# Patient Record
Sex: Female | Born: 1980 | Race: Black or African American | Hispanic: No | Marital: Single | Smoking: Current every day smoker
Health system: Southern US, Community
[De-identification: ages and names within clinical notes are randomized; demographics above are authoritative.]

## PROBLEM LIST (undated history)

## (undated) DIAGNOSIS — R87629 Unspecified abnormal cytological findings in specimens from vagina: Secondary | ICD-10-CM

## (undated) DIAGNOSIS — A599 Trichomoniasis, unspecified: Secondary | ICD-10-CM

## (undated) DIAGNOSIS — I10 Essential (primary) hypertension: Secondary | ICD-10-CM

## (undated) DIAGNOSIS — IMO0002 Reserved for concepts with insufficient information to code with codable children: Secondary | ICD-10-CM

## (undated) DIAGNOSIS — Q774 Achondroplasia: Secondary | ICD-10-CM

## (undated) DIAGNOSIS — B977 Papillomavirus as the cause of diseases classified elsewhere: Secondary | ICD-10-CM

## (undated) HISTORY — DX: Achondroplasia: Q77.4

## (undated) HISTORY — PX: OTHER SURGICAL HISTORY: SHX169

## (undated) HISTORY — DX: Reserved for concepts with insufficient information to code with codable children: IMO0002

## (undated) HISTORY — DX: Essential (primary) hypertension: I10

## (undated) HISTORY — DX: Trichomoniasis, unspecified: A59.9

## (undated) HISTORY — DX: Unspecified abnormal cytological findings in specimens from vagina: R87.629

## (undated) HISTORY — DX: Papillomavirus as the cause of diseases classified elsewhere: B97.7

---

## 2003-03-11 ENCOUNTER — Observation Stay (HOSPITAL_COMMUNITY): Admission: EM | Admit: 2003-03-11 | Discharge: 2003-03-12 | Payer: Self-pay

## 2003-03-12 ENCOUNTER — Encounter: Payer: Self-pay | Admitting: Internal Medicine

## 2004-12-11 ENCOUNTER — Ambulatory Visit: Payer: Self-pay

## 2005-04-01 ENCOUNTER — Ambulatory Visit (HOSPITAL_COMMUNITY): Admission: RE | Admit: 2005-04-01 | Discharge: 2005-04-01 | Payer: Self-pay | Admitting: Family Medicine

## 2006-08-06 IMAGING — CR DG ABDOMEN 1V
1 series · 1 of 1 positions shown · non-contrast
Comparison: None.

CLINICAL DATA: Questionable palpable mass over the lumbosacral spine on palpation, question severe constipation.  History of achondroplasia.
ABDOMEN - [id number]:

[view not recorded]
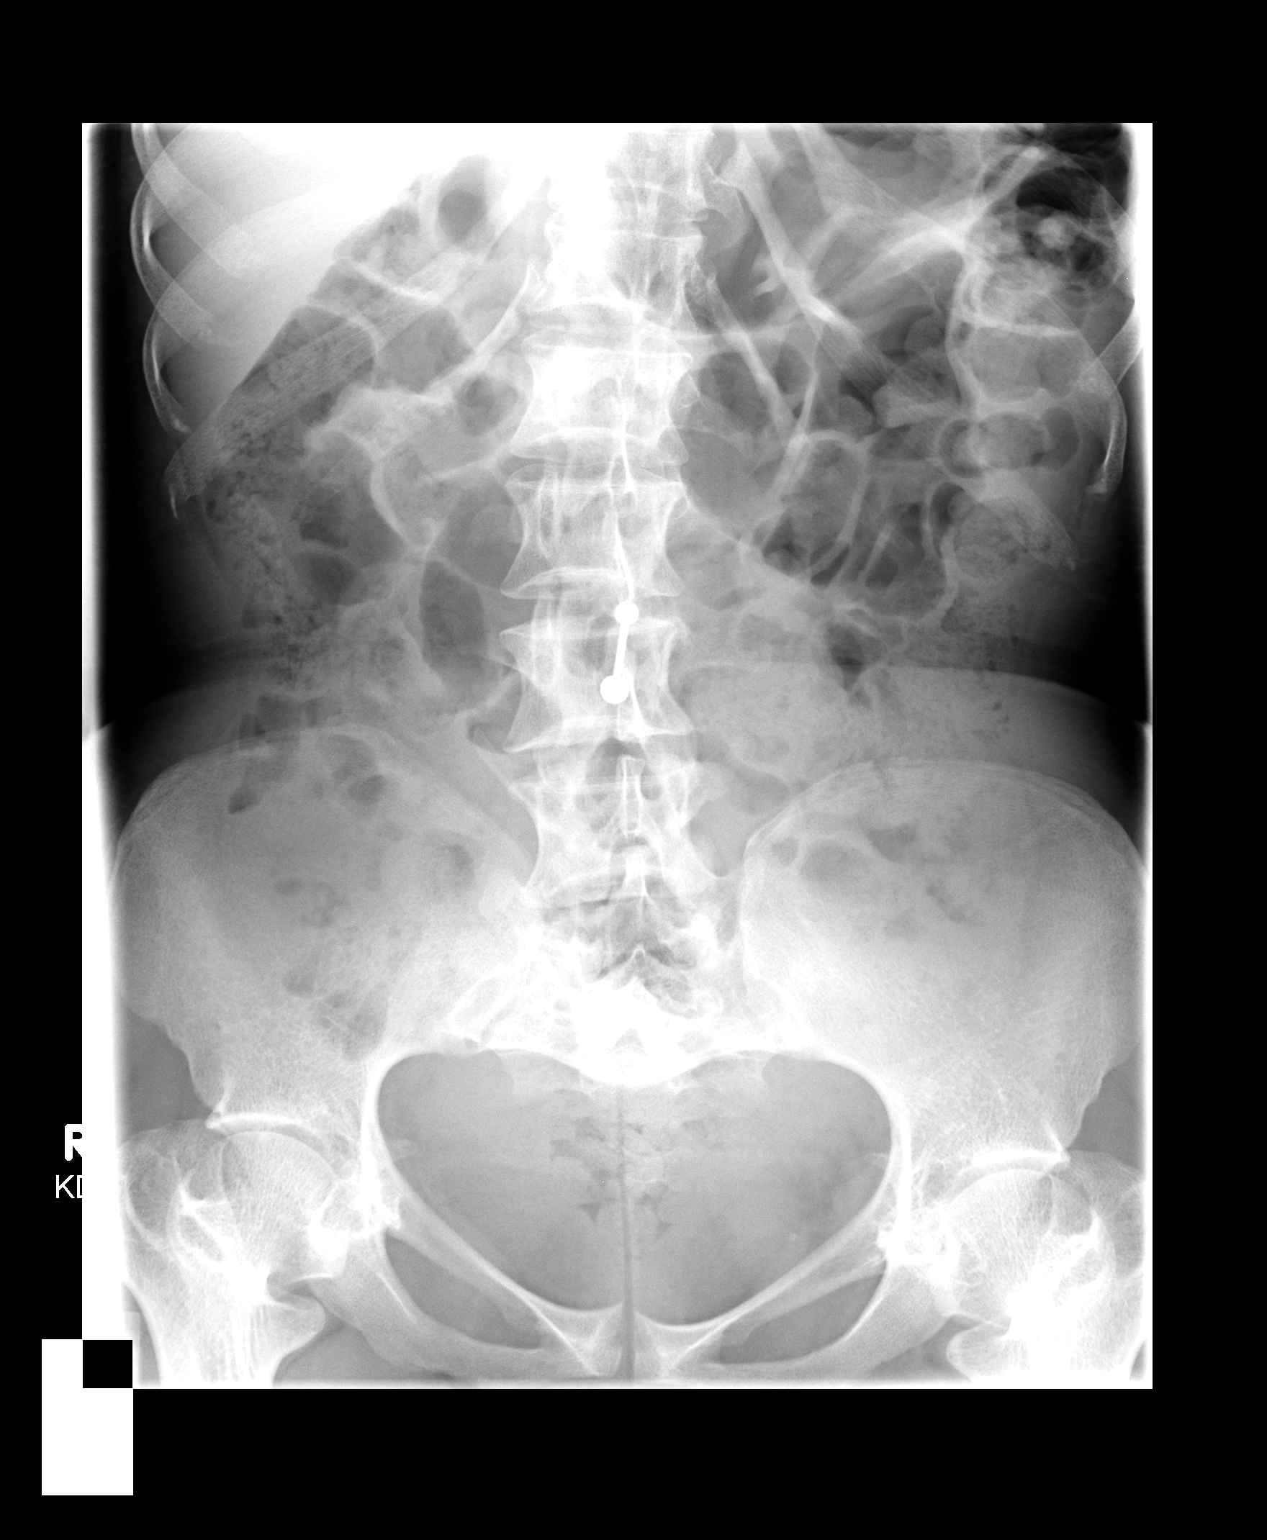

[1 of 1 positions shown; findings below may reference images not displayed]

FINDINGS: Stool was seen throughout the length of the nondilated colon.  No dilated loop of small bowel is present. Presence or absence of free air cannot be assessed on this single supine view. A radiopaque object projects over the lumbar spine, likely a belly button ring. The pelvis has an abnormal configuration, with spurring of the iliac spines and foreshortening of the femoral necks bilaterally, consistent with achondroplasia.
IMPRESSION: 1.  No radiographic evidence for obstruction on the single supine film.  Stool throughout the colon without evidence of colonic dilatation. 
2.  Osseous abnormalities consistent with known achondroplasia.
3.  The presence or absence of free air cannot be assessed on the single supine view. 
The findings were called to Dr. Jim?[REDACTED] by Dr. Jefferson on 04/01/05.

## 2012-11-07 ENCOUNTER — Encounter: Payer: Self-pay | Admitting: *Deleted

## 2012-11-07 DIAGNOSIS — IMO0002 Reserved for concepts with insufficient information to code with codable children: Secondary | ICD-10-CM

## 2012-11-14 ENCOUNTER — Encounter: Payer: Self-pay | Admitting: *Deleted

## 2012-11-15 ENCOUNTER — Encounter: Payer: Self-pay | Admitting: Obstetrics & Gynecology

## 2012-11-21 ENCOUNTER — Encounter: Payer: Self-pay | Admitting: Obstetrics & Gynecology

## 2012-11-23 ENCOUNTER — Ambulatory Visit (INDEPENDENT_AMBULATORY_CARE_PROVIDER_SITE_OTHER): Payer: Medicaid Other | Admitting: Obstetrics & Gynecology

## 2012-11-23 ENCOUNTER — Encounter: Payer: Self-pay | Admitting: Obstetrics & Gynecology

## 2012-11-23 VITALS — BP 114/80 | Ht <= 58 in | Wt 83.0 lb

## 2012-11-23 DIAGNOSIS — R87810 Cervical high risk human papillomavirus (HPV) DNA test positive: Secondary | ICD-10-CM

## 2012-11-23 DIAGNOSIS — R87619 Unspecified abnormal cytological findings in specimens from cervix uteri: Secondary | ICD-10-CM | POA: Insufficient documentation

## 2012-11-23 DIAGNOSIS — IMO0002 Reserved for concepts with insufficient information to code with codable children: Secondary | ICD-10-CM

## 2012-11-23 DIAGNOSIS — R87610 Atypical squamous cells of undetermined significance on cytologic smear of cervix (ASC-US): Secondary | ICD-10-CM

## 2012-11-23 DIAGNOSIS — Q774 Achondroplasia: Secondary | ICD-10-CM

## 2012-11-23 NOTE — Patient Instructions (Addendum)
Cervical Dysplasia Cervical dysplasia is a condition in which a woman has abnormal changes in the cells of her cervix. The cervix is the opening to the uterus (womb) between the vagina and the uterus. These changes are called cervical dysplasia and may be the first signs of cervical cancer. These cells can be taken from the cervix during a Pap test and then looked at under a microscope. With early detection, treatment, and close follow-up care, nearly all cervical dysplasia can be cured. If untreated, the mild to moderate stages of dysplasia often grow more severe.  RISK FACTORS  The following increase the risk for cervical dysplasia.  Having had a sexually transmitted disease, including:  Chlamydia.  Human papilloma virus (HPV).  Becoming sexually active before age 18.  Having had more than 1 sexual partner.  Not using protection, such as condoms, during sexual intercourse, especially with new sexual partners.  Having had cancer of the vagina or vulva.  Having a sexual partner whose previous partner had cancer of the cervix or cervical dysplasia.  Having a sexual partner who has or has had cancer of the penis.  Having a weakened immune system (HIV, organ transplant).  Being the daughter of a woman who took DES (diethylstilbestrol) during pregnancy.  A history of cervical cancer in a woman's sister or mother.  Smoking.  Having had an abnormal Pap test in the past. SYMPTOMS  There are usually no symptoms. If there are symptoms, they may be vague such as:  Abnormal vaginal discharge.  Bleeding between periods or following intercourse.  Bleeding during menopause.  Pain on intercourse (dyspareunia). DIAGNOSIS   The Pap test is the best way of detecting abnormalities of the cervix.  Biopsy (removing a piece of tissue to look at under the microscope) of the cervix when the Pap test is abnormal or when the Pap test is normal, but the cervix looks abnormal. TREATMENT    Catching and treating the changes early with Pap tests can prevent cervical cancer.  Cryotherapy freezes the abnormal cells with a steel tip instrument.  A laser can be used to remove the abnormal cells.  Loop electrocautery excision procedure (LEEP). This procedure uses a heated electrical loop to remove a cone-like portion of the cervix, including the cervical canal.  For more serious cases of cervical dysplasia, the abnormal tissue may be removed surgically by:  A cone biopsy (by cold knife, laser or LEEP). A procedure in which a portion of the center of the cervix with the cervical canal is removed.  The uterus and cervix are removed (hysterectomy). Your caregiver will advise you regarding the need and timing of Pap tests in your follow-up. Women who have been treated for dysplasia should be closely followed with pelvic exams and Pap tests. During the first year following treatment of cervical dysplasia, Pap tests should be done every 3 to 4 months. In the second year, the schedule is every 6 months, or as recommended by your caregiver. See your caregiver for new or worsening problems. HOME CARE INSTRUCTIONS   Follow the instructions and recommendations of your caregiver regarding medicines and follow-up appointments.  Only take over-the-counter or prescription medicines for pain or discomfort as directed by your caregiver.  Cramping and pelvic discomfort may follow cryotherapy. It is not abnormal to have watery discharge for several weeks after.  Laser, cone surgery, cryotherapy or LEEP can cause a bad smelling vaginal discharge. It may also cause vaginal bleeding for a couple weeks following the procedure. The   discharge may be black from the paste used to control bleeding from the cone site. This is normal.  Do not use tampons, have sexual intercourse or douche until your caregiver says it is okay. SEEK MEDICAL CARE IF:   You develop genital warts.  You need a prescription for  pain medicine following your treatment. SEEK IMMEDIATE MEDICAL CARE IF:   Your bleeding is heavier than a normal menstrual period.  You develop bright red bleeding, especially if you have blood clots.  You have a fever.  You have increasing cramps or pain not relieved with medicine.  You are lightheaded, unusually weak, or have fainting spells.  You have abnormal vaginal discharge.  You develop abdominal pain. PREVENTION   The surest way to prevent cervical dysplasia is to abstain from sexual intercourse.  Practice safe sex, use condoms and have only one sex partner who does not have other sex partners.  A Pap test is done to screen for cervical cancer.  The first Pap test should be done at age 21.  Between ages 21 and 29, Pap tests are repeated every 2 years.  Beginning at age 30, you are advised to have a Pap test every 3 years as long as your past 3 Pap tests have been normal.  Some women have medical problems that increase the chance of getting cervical cancer. Talk to your caregiver about these problems. It is especially important to talk to your caregiver if a new problem develops soon after your last Pap test. In these cases, your caregiver may recommend more frequent screening and Pap tests.  The above recommendations are the same for women who have or have not gotten the vaccine for HPV (Human Papillomavirus).  If you had a hysterectomy for a problem that was not a cancer or a condition that could lead to cancer, then you no longer need Pap tests. However, even if you no longer need a Pap test, a regular exam is a good idea to make sure no other problems are starting.   If you are between ages 65 and 70, and you have had normal Pap tests going back 10 years, you no longer need Pap tests. However, even if you no longer need a Pap test, a regular exam is a good idea to make sure no other problems are starting.   If you have had past treatment for cervical cancer or a  condition that could lead to cancer, you need Pap tests and screening for cancer for at least 20 years after your treatment.  If Pap tests have been discontinued, risk factors (such as a new sexual partner) need to be re-assessed to determine if screening should be resumed.  Some women may need screenings more often if they are at high risk for cervical cancer.  Your caregiver may do additional tests including:  Colposcopy. A procedure in which a special microscope magnifies the cells and allows the provider to closely examine the cervix, vagina, and vulva.  Biopsy. A small tissue sample is taken from the cervix, vagina or vulva. This is generally done in your caregivers office.  A cone biopsy (cold knife or laser). A large tissue sample is taken from the cervix. This procedure is usually done in an operating room under a general anesthetic. The cone often removes all abnormal tissue and so may also complete the treatment.  LEEP, also removing a circular portion of the cervix and is done in a doctors office under a local anesthetic.  Now   there is a vaccine, Gardasil, that was developed to prevent the HPV'S that can cause cancer of the cervix and genital warts. It is recommended for females ages 9 to 26. It should not be given to pregnant women until more is known about its effects on the fetus. Not all cancers of the cervix are caused by the HPV. Routine gynecology exams and Pap tests should continue as recommended by your caregiver. Document Released: 07/20/2005 Document Revised: 10/12/2011 Document Reviewed: 07/11/2008 [employer] Patient Information 2013 [employer].  

## 2012-11-23 NOTE — Progress Notes (Signed)
Patient ID: Adrienne Macdonald, female   DOB: 14-Dec-1980, 32 y.o.   MRN: [id number] Patient ID: Adrienne Macdonald, female   DOB: [birth year], 32 y.o.   MRN: [id number]  Chief Complaint  Patient presents with  . Referral    abnormal pap    HPI Adrienne Macdonald is a 32 y.o. female.   HPI  Indications: Pap smear on March 2014 showed: ASCUS with POSITIVE high risk HPV. Previous colposcopy: normal exam without visible pathology and in 2013. Prior cervical treatment: no treatment.  Past Medical History  Diagnosis Date  . Abnormal Pap smear   . Trichimoniasis     Past Surgical History  Procedure Laterality Date  . Cesarean section      Family History  Problem Relation Age of Onset  . Hypertension Mother   . Hyperlipidemia Mother   . Thyroid disease Mother   . Stroke Father   . Hypertension Father     Social History History  Substance Use Topics  . Smoking status: Current Every Day Smoker -- 0.50 packs/day    Types: Cigarettes  . Smokeless tobacco: Not on file  . Alcohol Use: Yes     Comment: socially    Allergies  Allergen Reactions  . Penicillins     Current Outpatient Prescriptions  Medication Sig Dispense Refill  . lisinopril-hydrochlorothiazide (PRINZIDE,ZESTORETIC) 10-12.5 MG per tablet Take 1 tablet by mouth daily.      . medroxyPROGESTERone (DEPO-PROVERA) 150 MG/ML injection Inject 150 mg into the muscle every 3 (three) months.       No current facility-administered medications for this visit.    Review of Systems Review of Systems  Blood pressure 114/80, height 4\' 2"  (1.27 m), weight 83 lb (37.649 kg), last menstrual period 10/15/2012.  Physical Exam Physical Exam  Data Reviewed pap  Assessment    Procedure Details  The risks and benefits of the procedure and Written informed consent obtained.  Speculum placed in vagina and excellent visualization of cervix achieved, cervix swabbed x 3 with acetic acid solution.  Specimens: none  Complications:  none.   Normal colposcopy  Plan    Repeat pap 1 year hre      [name] 11/23/2012, 4:38 PM

## 2014-06-04 ENCOUNTER — Encounter: Payer: Self-pay | Admitting: Obstetrics & Gynecology

## 2014-06-11 DIAGNOSIS — R87629 Unspecified abnormal cytological findings in specimens from vagina: Secondary | ICD-10-CM

## 2014-06-11 DIAGNOSIS — B977 Papillomavirus as the cause of diseases classified elsewhere: Secondary | ICD-10-CM

## 2014-06-11 HISTORY — DX: Unspecified abnormal cytological findings in specimens from vagina: R87.629

## 2014-06-11 HISTORY — DX: Papillomavirus as the cause of diseases classified elsewhere: B97.7

## 2014-06-11 LAB — HM PAP SMEAR: HM PAP: POSITIVE

## 2014-07-17 ENCOUNTER — Encounter: Payer: Self-pay | Admitting: *Deleted

## 2014-07-18 ENCOUNTER — Encounter: Payer: Self-pay | Admitting: Obstetrics & Gynecology

## 2014-07-18 ENCOUNTER — Encounter: Payer: Medicaid Other | Admitting: Obstetrics & Gynecology

## 2014-12-18 ENCOUNTER — Encounter: Payer: Self-pay | Admitting: *Deleted

## 2014-12-24 ENCOUNTER — Ambulatory Visit (INDEPENDENT_AMBULATORY_CARE_PROVIDER_SITE_OTHER): Payer: Medicaid Other | Admitting: Obstetrics & Gynecology

## 2014-12-24 ENCOUNTER — Other Ambulatory Visit: Payer: Self-pay | Admitting: Obstetrics & Gynecology

## 2014-12-24 ENCOUNTER — Encounter: Payer: Self-pay | Admitting: Obstetrics & Gynecology

## 2014-12-24 VITALS — BP 180/100 | HR 72 | Wt 92.0 lb

## 2014-12-24 DIAGNOSIS — N900 Mild vulvar dysplasia: Secondary | ICD-10-CM

## 2014-12-24 DIAGNOSIS — R896 Abnormal cytological findings in specimens from other organs, systems and tissues: Secondary | ICD-10-CM

## 2014-12-24 DIAGNOSIS — IMO0002 Reserved for concepts with insufficient information to code with codable children: Secondary | ICD-10-CM

## 2014-12-24 NOTE — Addendum Note (Signed)
Addended by: Criss AlvinePULLIAM, CHRYSTAL G on: 12/24/2014 03:47 PM   Modules accepted: Orders

## 2014-12-24 NOTE — Progress Notes (Signed)
Patient ID: Adrienne Macdonald, female   DOB: 12-29-1980, 34 y.o.   MRN: [id number] . Colposcopy Procedure Note  Indications: Pap smear 6 months ago showed: low-grade squamous intraepithelial neoplasia (LGSIL - encompassing HPV,mild dysplasia,CIN I). The prior pap showed ASCUS with NEGATIVE high risk HPV.  Prior cervical/vaginal disease: normal exam without visible pathology. Prior cervical treatment: no treatment.  Procedure Details  The risks and benefits of the procedure and Written informed consent obtained.  Speculum placed in vagina and excellent visualization of cervix achieved, cervix swabbed x 3 with acetic acid solution.  Findings: Cervix: acetowhite lesion(s) noted at  o'clock and punctation noted at  o'clock; cervical biopsies taken at 7 o'clock. Vaginal inspection: normal without visible lesions. Vulvar colposcopy: vulvar colposcopy not performed.  Specimens: cervical bio[psy x 1  Complications: none.  Plan: Return to discuss Pathology results in 1 week.

## 2015-01-01 ENCOUNTER — Ambulatory Visit: Payer: Medicaid Other | Admitting: Obstetrics & Gynecology

## 2015-01-02 ENCOUNTER — Ambulatory Visit: Payer: Medicaid Other | Admitting: Obstetrics & Gynecology

## 2015-01-02 ENCOUNTER — Telehealth: Payer: Self-pay | Admitting: Obstetrics & Gynecology

## 2015-01-02 NOTE — Telephone Encounter (Signed)
Pt states she had an appt today to discuss cervical biopsy unable to come due to car trouble. Requesting to get results by phone.

## 2015-01-02 NOTE — Telephone Encounter (Signed)
We need to repeat her Pap in 1 year, no treatment at this level needed opefully her own "immune" systme will take care of it, but she has to make sure she gets her follow up next year

## 2015-01-02 NOTE — Telephone Encounter (Signed)
Pt informed of mild dysplasia from cervical biopsy from 12/24/2014. Pt to repeat pap in 1 year. Pt verbalized understanding.

## 2017-05-03 ENCOUNTER — Encounter: Payer: Self-pay | Admitting: *Deleted

## 2017-05-04 ENCOUNTER — Encounter: Payer: Self-pay | Admitting: *Deleted

## 2017-05-04 ENCOUNTER — Encounter: Payer: Self-pay | Admitting: Obstetrics & Gynecology

## 2018-02-17 ENCOUNTER — Encounter: Payer: Medicaid Other | Admitting: Obstetrics & Gynecology

## 2018-02-17 ENCOUNTER — Encounter: Payer: Self-pay | Admitting: *Deleted

## 2018-03-08 ENCOUNTER — Ambulatory Visit: Payer: Medicaid Other | Admitting: Podiatry

## 2018-03-09 ENCOUNTER — Ambulatory Visit: Payer: Medicaid Other | Admitting: Podiatry

## 2018-03-18 ENCOUNTER — Encounter: Payer: Medicaid Other | Admitting: Podiatry

## 2018-03-23 NOTE — Progress Notes (Signed)
This encounter was created in error - please disregard.

## 2019-10-19 ENCOUNTER — Telehealth: Payer: Self-pay | Admitting: Obstetrics & Gynecology

## 2019-10-19 NOTE — Telephone Encounter (Signed)
Could not call the patient to remind her of her appointment/restrictions due to there is no phone number on file.

## 2019-10-23 ENCOUNTER — Encounter: Payer: Self-pay | Admitting: Obstetrics & Gynecology

## 2020-11-14 ENCOUNTER — Encounter: Payer: Medicaid Other | Admitting: Women's Health

## 2020-11-26 ENCOUNTER — Ambulatory Visit: Payer: Medicaid Other | Admitting: Women's Health

## 2020-11-26 ENCOUNTER — Encounter: Payer: Self-pay | Admitting: Women's Health

## 2020-11-26 ENCOUNTER — Other Ambulatory Visit: Payer: Self-pay

## 2020-11-26 VITALS — BP 147/96 | HR 83 | Ht <= 58 in | Wt 86.5 lb

## 2020-11-26 DIAGNOSIS — Z3202 Encounter for pregnancy test, result negative: Secondary | ICD-10-CM | POA: Diagnosis not present

## 2020-11-26 DIAGNOSIS — R87613 High grade squamous intraepithelial lesion on cytologic smear of cervix (HGSIL): Secondary | ICD-10-CM

## 2020-11-26 LAB — POCT URINE PREGNANCY: Preg Test, Ur: NEGATIVE

## 2020-11-26 NOTE — Progress Notes (Signed)
   COLPOSCOPY PROCEDURE NOTE Patient name: Adrienne Macdonald MRN [id number]  Date of birth: 01/02/1981 Subjective Findings:   Adrienne Macdonald is a 40 y.o. G36P1001 African American female being seen today for a colposcopy. Indication: Abnormal pap on 09/15/19: HSIL w/ HRHPV positive @ [facility]  Prior cytology:  Date Result Procedure  2018 LSIL w/ +HRHPV did not come for colpo  2015 LSIL w/ HRHPV positive Colposcopy: w/ bx CIN-I  2014 LSIL-H w/ HRHPV  not done None      Patient's last menstrual period was 10/27/2020 (approximate). Contraception: Depo-Provera injections. Menopausal: no. Hysterectomy: no.   Smoker: yes. Immunocompromised: no.  The risks and benefits were explained and informed consent was obtained, and written copy is in chart. Pertinent History Reviewed:   Reviewed past medical,surgical, social, obstetrical and family history.  Reviewed problem list, medications and allergies. Objective Findings & Procedure:   Vitals:   11/26/20 1123  BP: (!) 147/96  Pulse: 83  Weight: 86 lb 8 oz (39.2 kg)  Height: 4\' 2"  (1.27 m)  Body mass index is 24.33 kg/m.  Results for orders placed or performed in visit on 11/26/20 (from the past 24 hour(s))  POCT urine pregnancy   Collection Time: 11/26/20 11:35 AM  Result Value Ref Range   Preg Test, Ur Negative Negative     Time out was performed.  Speculum placed in the vagina, cervix fully visualized. SCJ: fully visualized. Cervix swabbed x 3 with acetic acid.  Acetowhitening present: Yes Cervix: circumferential light ACW w/ mosaiscm from 12-2 o'clock. cervical biopsies taken at 12-1 o'clock and hemostasis achieved with Monsel's solution. Vagina: vaginal colposcopy not performed Vulva: vulvar colposcopy not performed  Specimens: 1  Complications: none  Preceptor: Dr. 11/28/20 Colposcopic Impression & Plan:   Colposcopy findings consistent with HSIL Plan: Post biopsy instructions given, Will base plan of care on pathology results and  ASCCP guidelines and Will call patient with results  Return for will call pt w/ results.  Despina Hidden CNM, [facility] 11/26/2020 1:08 PM

## 2020-11-26 NOTE — Patient Instructions (Signed)
[email or web address]  Colposcopy, Care After This sheet gives you information about how to care for yourself after your procedure. Your health care provider may also give you more specific instructions. If you have problems or questions, contact your health care provider. What can I expect after the procedure? If you had a colposcopy without a biopsy, you can expect to feel fine right away after your procedure. However, you may have some spotting of blood for a few days. You can return to your normal activities. If you had a colposcopy with a biopsy, it is common after the procedure to have:  Soreness and mild pain. These may last for a few days.  Light-headedness.  Mild vaginal bleeding or discharge that is dark-colored and grainy. This may last for a few days. The discharge may be caused by a liquid (solution) that was used during the procedure. You may need to wear a sanitary pad during this time.  Spotting of blood for at least 48 hours after the procedure. Follow these instructions at home: Medicines  Take over-the-counter and prescription medicines only as told by your health care provider.  Talk with your health care provider about what type of over-the-counter pain medicine and prescription medicine you can start to take again. It is especially important to talk with your health care provider if you take blood thinners. Activity  Limit your physical activity for the first day after your procedure as told by your health care provider.  Avoid using douche products, using tampons, or having sex for at least 3 days after the procedure or for as long as told.  Return to your normal activities as told by your health care provider. Ask your health care provider what activities are safe for you. General instructions  Drink enough fluid to keep your urine pale yellow.  Ask your health care provider if you may take baths, swim, or use a hot tub. You may take  showers.  If you use birth control (contraception), continue to use it.  Keep all follow-up visits as told by your health care provider. This is important.   Contact a health care provider if:  You develop a skin rash. Get help right away if:  You bleed a lot from your vagina or pass blood clots. This includes using more than one sanitary pad each hour for 2 hours in a row.  You have a fever or chills.  You have vaginal discharge that is abnormal, is yellow in color, or smells bad. This could be a sign of infection.  You have severe pain or cramps in your lower abdomen that do not go away with medicine.  You faint. Summary  If you had a colposcopy without a biopsy, you can expect to feel fine right away, but you may have some spotting of blood for a few days. You can return to your normal activities.  If you had a colposcopy with a biopsy, it is common to have mild pain for a few days and spotting for 48 hours after the procedure.  Avoid using douche products, using tampons, and having sex for at least 3 days after the procedure or for as long as told by your health care provider.  Get help right away if you have heavy bleeding, severe pain, or signs of infection. This information is not intended to replace advice given to you by your health care provider. Make sure you discuss any questions you have with your health care provider. Document Revised: 07/19/2019 Document Reviewed:   07/19/2019 Elsevier Patient Education  2021 [employer]  

## 2020-12-03 ENCOUNTER — Telehealth: Payer: Self-pay | Admitting: Women's Health

## 2020-12-03 NOTE — Telephone Encounter (Signed)
Pt notified of colpo bx result and need for appt w/ MD to discuss tx. Transferred to front office to schedule. Questions answered.

## 2020-12-19 ENCOUNTER — Encounter: Payer: Medicaid Other | Admitting: Obstetrics & Gynecology

## 2021-01-13 ENCOUNTER — Encounter: Payer: Medicaid Other | Admitting: Obstetrics & Gynecology

## 2021-03-25 ENCOUNTER — Encounter: Payer: Medicaid Other | Admitting: Obstetrics & Gynecology
